# Patient Record
Sex: Female | Born: 1935 | Race: White | Hispanic: No | Marital: Single | Smoking: Never smoker
Health system: Southern US, Community
[De-identification: ages and names within clinical notes are randomized; demographics above are authoritative.]

## PROBLEM LIST (undated history)

## (undated) DIAGNOSIS — F0390 Unspecified dementia without behavioral disturbance: Secondary | ICD-10-CM

---

## 2017-05-08 ENCOUNTER — Emergency Department (HOSPITAL_COMMUNITY): Payer: Medicare Other

## 2017-05-08 ENCOUNTER — Observation Stay (HOSPITAL_COMMUNITY)
Admission: EM | Admit: 2017-05-08 | Discharge: 2017-05-09 | Disposition: A | Discharge status: Home / Self Care | Payer: Medicare Other | Attending: Family Medicine | Admitting: Family Medicine

## 2017-05-08 ENCOUNTER — Encounter (HOSPITAL_COMMUNITY): Payer: Self-pay | Admitting: *Deleted

## 2017-05-08 DIAGNOSIS — M1611 Unilateral primary osteoarthritis, right hip: Secondary | ICD-10-CM | POA: Diagnosis not present

## 2017-05-08 DIAGNOSIS — W19XXXA Unspecified fall, initial encounter: Secondary | ICD-10-CM | POA: Diagnosis not present

## 2017-05-08 DIAGNOSIS — M545 Low back pain, unspecified: Secondary | ICD-10-CM

## 2017-05-08 DIAGNOSIS — Z79899 Other long term (current) drug therapy: Secondary | ICD-10-CM | POA: Diagnosis not present

## 2017-05-08 DIAGNOSIS — M47816 Spondylosis without myelopathy or radiculopathy, lumbar region: Secondary | ICD-10-CM | POA: Insufficient documentation

## 2017-05-08 DIAGNOSIS — I491 Atrial premature depolarization: Secondary | ICD-10-CM | POA: Diagnosis not present

## 2017-05-08 DIAGNOSIS — E440 Moderate protein-calorie malnutrition: Secondary | ICD-10-CM | POA: Diagnosis not present

## 2017-05-08 DIAGNOSIS — N3289 Other specified disorders of bladder: Secondary | ICD-10-CM | POA: Diagnosis not present

## 2017-05-08 DIAGNOSIS — R55 Syncope and collapse: Secondary | ICD-10-CM | POA: Diagnosis not present

## 2017-05-08 DIAGNOSIS — E785 Hyperlipidemia, unspecified: Secondary | ICD-10-CM | POA: Insufficient documentation

## 2017-05-08 DIAGNOSIS — Z681 Body mass index (BMI) 19 or less, adult: Secondary | ICD-10-CM | POA: Insufficient documentation

## 2017-05-08 DIAGNOSIS — I10 Essential (primary) hypertension: Secondary | ICD-10-CM | POA: Insufficient documentation

## 2017-05-08 DIAGNOSIS — R531 Weakness: Secondary | ICD-10-CM | POA: Insufficient documentation

## 2017-05-08 DIAGNOSIS — F0390 Unspecified dementia without behavioral disturbance: Secondary | ICD-10-CM | POA: Diagnosis not present

## 2017-05-08 HISTORY — DX: Unspecified dementia, unspecified severity, without behavioral disturbance, psychotic disturbance, mood disturbance, and anxiety: F03.90

## 2017-05-08 LAB — CBC WITH DIFFERENTIAL/PLATELET
BASOS ABS: 0 10*3/uL (ref 0.0–0.1)
Basophils Relative: 0 %
EOS PCT: 0 %
Eosinophils Absolute: 0 10*3/uL (ref 0.0–0.7)
HCT: 40.1 % (ref 36.0–46.0)
HEMOGLOBIN: 13.5 g/dL (ref 12.0–15.0)
LYMPHS PCT: 23 %
Lymphs Abs: 1.3 10*3/uL (ref 0.7–4.0)
MCH: 30.5 pg (ref 26.0–34.0)
MCHC: 33.7 g/dL (ref 30.0–36.0)
MCV: 90.7 fL (ref 78.0–100.0)
Monocytes Absolute: 0.5 10*3/uL (ref 0.1–1.0)
Monocytes Relative: 10 %
NEUTROS PCT: 67 %
Neutro Abs: 3.7 10*3/uL (ref 1.7–7.7)
PLATELETS: 230 10*3/uL (ref 150–400)
RBC: 4.42 MIL/uL (ref 3.87–5.11)
RDW: 12.8 % (ref 11.5–15.5)
WBC: 5.6 10*3/uL (ref 4.0–10.5)

## 2017-05-08 LAB — HEPATIC FUNCTION PANEL
ALK PHOS: 53 U/L (ref 38–126)
ALT: 9 U/L — ABNORMAL LOW (ref 14–54)
AST: 16 U/L (ref 15–41)
Albumin: 4.2 g/dL (ref 3.5–5.0)
BILIRUBIN TOTAL: 0.7 mg/dL (ref 0.3–1.2)
Bilirubin, Direct: <0.1 mg/dL — ABNORMAL LOW (ref 0.1–0.5)
TOTAL PROTEIN: 7.9 g/dL (ref 6.5–8.1)

## 2017-05-08 LAB — BASIC METABOLIC PANEL
ANION GAP: 10 (ref 5–15)
BUN: 17 mg/dL (ref 6–20)
CO2: 25 mmol/L (ref 22–32)
Calcium: 9.3 mg/dL (ref 8.9–10.3)
Chloride: 105 mmol/L (ref 101–111)
Creatinine, Ser: 0.96 mg/dL (ref 0.44–1.00)
GFR, EST NON AFRICAN AMERICAN: 54 mL/min — AB (ref >60)
Glucose, Bld: 100 mg/dL — ABNORMAL HIGH (ref 65–99)
POTASSIUM: 3.9 mmol/L (ref 3.5–5.1)
SODIUM: 140 mmol/L (ref 135–145)

## 2017-05-08 LAB — TYPE AND SCREEN
ABO/RH(D): O POS
ANTIBODY SCREEN: NEGATIVE

## 2017-05-08 LAB — PROTIME-INR
INR: 0.99
PROTHROMBIN TIME: 13.1 s (ref 11.4–15.2)

## 2017-05-08 LAB — TROPONIN I: Troponin I: <0.03 ng/mL (ref <0.03)

## 2017-05-08 LAB — ABO/RH: ABO/RH(D): O POS

## 2017-05-08 LAB — LIPASE, BLOOD: LIPASE: 37 U/L (ref 11–51)

## 2017-05-08 LAB — TSH: TSH: 1.152 u[IU]/mL (ref 0.350–4.500)

## 2017-05-08 LAB — MRSA PCR SCREENING: MRSA by PCR: NEGATIVE

## 2017-05-08 MED ORDER — ASPIRIN EC 81 MG PO TBEC
162.0000 mg | DELAYED_RELEASE_TABLET | Freq: Every day | ORAL | Status: DC
  Administered 2017-05-08 – 2017-05-09 (×2): 162 mg via ORAL
  Filled 2017-05-08 (×2): qty 2

## 2017-05-08 MED ORDER — ONDANSETRON HCL 4 MG/2ML IJ SOLN: 4.0000 mg | Freq: Four times a day (QID) | INTRAMUSCULAR | Status: DC | PRN

## 2017-05-08 MED ORDER — LISINOPRIL 20 MG PO TABS
20.0000 mg | ORAL_TABLET | Freq: Every day | ORAL | Status: DC
  Administered 2017-05-09: 20 mg via ORAL
  Filled 2017-05-08: qty 1

## 2017-05-08 MED ORDER — ZOLPIDEM TARTRATE 5 MG PO TABS: 5.0000 mg | ORAL_TABLET | Freq: Every evening | ORAL | Status: DC | PRN

## 2017-05-08 MED ORDER — ACETAMINOPHEN 650 MG RE SUPP: 650.0000 mg | Freq: Four times a day (QID) | RECTAL | Status: DC | PRN

## 2017-05-08 MED ORDER — ONDANSETRON HCL 4 MG PO TABS: 4.0000 mg | ORAL_TABLET | Freq: Four times a day (QID) | ORAL | Status: DC | PRN

## 2017-05-08 MED ORDER — SODIUM CHLORIDE 0.9 % IV SOLN
INTRAVENOUS | Status: DC
  Administered 2017-05-08 – 2017-05-09 (×4): via INTRAVENOUS

## 2017-05-08 MED ORDER — MORPHINE SULFATE (PF) 2 MG/ML IV SOLN
4.0000 mg | Freq: Once | INTRAVENOUS | Status: AC
  Administered 2017-05-08: 4 mg via INTRAVENOUS
  Filled 2017-05-08: qty 2

## 2017-05-08 MED ORDER — SODIUM CHLORIDE 0.9% FLUSH: 3.0000 mL | INTRAVENOUS | Status: DC | PRN

## 2017-05-08 MED ORDER — SIMVASTATIN 40 MG PO TABS
40.0000 mg | ORAL_TABLET | Freq: Every day | ORAL | Status: DC
  Administered 2017-05-09: 40 mg via ORAL
  Filled 2017-05-08: qty 1

## 2017-05-08 MED ORDER — SODIUM CHLORIDE 0.9% FLUSH: 3.0000 mL | Freq: Two times a day (BID) | INTRAVENOUS | Status: DC

## 2017-05-08 MED ORDER — ONDANSETRON HCL 4 MG/2ML IJ SOLN
4.0000 mg | Freq: Once | INTRAMUSCULAR | Status: AC
  Administered 2017-05-08: 4 mg via INTRAVENOUS
  Filled 2017-05-08: qty 2

## 2017-05-08 MED ORDER — HYDROCODONE-ACETAMINOPHEN 5-325 MG PO TABS
1.0000 | ORAL_TABLET | ORAL | Status: DC | PRN
  Filled 2017-05-08: qty 1

## 2017-05-08 MED ORDER — SODIUM CHLORIDE 0.9 % IV SOLN: 250.0000 mL | INTRAVENOUS | Status: DC | PRN

## 2017-05-08 MED ORDER — ACETAMINOPHEN 325 MG PO TABS
650.0000 mg | ORAL_TABLET | Freq: Four times a day (QID) | ORAL | Status: DC | PRN
  Administered 2017-05-09 (×2): 650 mg via ORAL
  Filled 2017-05-08 (×2): qty 2

## 2017-05-08 MED ORDER — HEPARIN SODIUM (PORCINE) 5000 UNIT/ML IJ SOLN
5000.0000 [IU] | Freq: Three times a day (TID) | INTRAMUSCULAR | Status: DC
  Administered 2017-05-08 – 2017-05-09 (×4): 5000 [IU] via SUBCUTANEOUS
  Filled 2017-05-08 (×4): qty 1

## 2017-05-08 NOTE — ED Notes (Signed)
Pt going to room 1437. Hilary RN accepting pt. Phone # for report [phone]

## 2017-05-08 NOTE — ED Notes (Signed)
ED TO INPATIENT HANDOFF REPORT  Name/Age/Gender Tonya Marks 83 y.o. female  Home/SNF/Other Nursing Home  Chief Complaint Fall  Code Status History    This patient does not have a recorded code status. Please follow your organizational policy for patients in this situation.    Advance Directive Documentation     Most Recent Value  Type of Advance Directive  Healthcare Power of Attorney, Living will  Pre-existing out of facility DNR order (yellow form or pink MOST form)  -  "MOST" Form in Place?  -      Level of Care/Admitting Diagnosis ED Disposition    ED Disposition Condition [facility]: [place] [100102]  Level of Care: Telemetry [5]  Admit to tele based on following criteria: Eval of Syncope  Diagnosis: Syncope [206001]  Admitting Physician: Laren Everts, [place]  Attending Physician: Laren Everts, ALI Marshal.B[place]  PT Class (Do Not Modify): Observation [104]  PT Acc Code (Do Not Modify): Observation [10022]       Medical History Past Medical History:  Diagnosis Date  . Dementia     Allergies No Known Allergies  IV Location/Drains/Wounds Patient Lines/Drains/Airways Status   Active Line/Drains/Airways    Name:   Placement date:   Placement time:   Site:   Days:   Peripheral IV 05/08/17 Left Antecubital  05/08/17    1222    Antecubital    less than 1          Labs/Imaging Results for orders placed or performed during the hospital encounter of 05/08/17 (from the past 48 hour(s))  Type and screen [place]     Status: None   Collection Time: 05/08/17 12:05 PM  Result Value Ref Range   ABO/RH(D) O POS    Antibody Screen NEG    Sample Expiration 48/09/6551   Basic metabolic panel     Status: Abnormal   Collection Time: 05/08/17 12:13 PM  Result Value Ref Range   Sodium 140 135 - 145 mmol/L   Potassium 3.9 3.5 - 5.1 mmol/L   Chloride 105 101 - 111 mmol/L   CO2 25 22 - 32 mmol/L   Glucose, Bld 100  (H) 65 - 99 mg/dL   BUN 17 6 - 20 mg/dL   Creatinine, Ser 0.96 0.44 - 1.00 mg/dL   Calcium 9.3 8.9 - 10.3 mg/dL   GFR calc non Af Amer 54 (L) >60 mL/min   GFR calc Af Amer >60 >60 mL/min    Comment: (NOTE) The eGFR has been calculated using the CKD EPI equation. This calculation has not been validated in all clinical situations. eGFR's persistently <60 mL/min signify possible Chronic Kidney Disease.    Anion gap 10 5 - 15  CBC WITH DIFFERENTIAL     Status: None   Collection Time: 05/08/17 12:13 PM  Result Value Ref Range   WBC 5.6 4.0 - 10.5 K/uL   RBC 4.42 3.87 - 5.11 MIL/uL   Hemoglobin 13.5 12.0 - 15.0 g/dL   HCT 40.1 36.0 - 46.0 %   MCV 90.7 78.0 - 100.0 fL   MCH 30.5 26.0 - 34.0 pg   MCHC 33.7 30.0 - 36.0 g/dL   RDW 12.8 11.5 - 15.5 %   Platelets 230 150 - 400 K/uL   Neutrophils Relative % 67 %   Neutro Abs 3.7 1.7 - 7.7 K/uL   Lymphocytes Relative 23 %   Lymphs Abs 1.3 0.7 - 4.0 K/uL   Monocytes  Relative 10 %   Monocytes Absolute 0.5 0.1 - 1.0 K/uL   Eosinophils Relative 0 %   Eosinophils Absolute 0.0 0.0 - 0.7 K/uL   Basophils Relative 0 %   Basophils Absolute 0.0 0.0 - 0.1 K/uL  Protime-INR     Status: None   Collection Time: 05/08/17 12:13 PM  Result Value Ref Range   Prothrombin Time 13.1 11.4 - 15.2 seconds   INR 0.99   Hepatic function panel     Status: Abnormal   Collection Time: 05/08/17 12:13 PM  Result Value Ref Range   Total Protein 7.9 6.5 - 8.1 g/dL   Albumin 4.2 3.5 - 5.0 g/dL   AST 16 15 - 41 U/L   ALT 9 (L) 14 - 54 U/L   Alkaline Phosphatase 53 38 - 126 U/L   Total Bilirubin 0.7 0.3 - 1.2 mg/dL   Bilirubin, Direct <0.1 (L) 0.1 - 0.5 mg/dL   Indirect Bilirubin NOT CALCULATED 0.3 - 0.9 mg/dL  Lipase, blood     Status: None   Collection Time: 05/08/17 12:13 PM  Result Value Ref Range   Lipase 37 11 - 51 U/L  Troponin I     Status: None   Collection Time: 05/08/17 12:13 PM  Result Value Ref Range   Troponin I <0.03 <0.03 ng/mL   Ct Abdomen  Pelvis Wo Contrast  Result Date: 05/08/2017 CLINICAL DATA:  Flank pain. EXAM: CT ABDOMEN AND PELVIS WITHOUT CONTRAST TECHNIQUE: Multidetector CT imaging of the abdomen and pelvis was performed following the standard protocol without IV contrast. COMPARISON:  None. FINDINGS: Lower chest: No acute abnormality. Hepatobiliary: No focal liver abnormality is seen. Status post cholecystectomy. No biliary dilatation. Pancreas: Unremarkable. No pancreatic ductal dilatation or surrounding inflammatory changes. Spleen: Normal in size without focal abnormality. Adrenals/Urinary Tract: Adrenal glands are unremarkable. Kidneys are normal, without renal calculi, focal lesion, or hydronephrosis. Mildly distended urinary bladder is noted. Stomach/Bowel: The stomach appears normal. There is no evidence of bowel obstruction or inflammation. Surgical anastomosis is seen involving the colon in the left lower quadrant. Vascular/Lymphatic: Aortic atherosclerosis. No enlarged abdominal or pelvic lymph nodes. Reproductive: Status post hysterectomy. No adnexal masses. Other: No abdominal wall hernia or abnormality. No abdominopelvic ascites. Musculoskeletal: No acute or significant osseous findings. IMPRESSION: Aortic atherosclerosis. Mildly distended urinary bladder. No other abnormality seen in the abdomen or pelvis. Electronically Signed   By: Marijo Conception, M.D.   On: 05/08/2017 14:16   Dg Lumbar Spine Complete  Result Date: 05/08/2017 CLINICAL DATA:  Unwitnessed fall. EXAM: LUMBAR SPINE - COMPLETE 4+ VIEW COMPARISON:  No recent prior. FINDINGS: Mild lumbar spine scoliosis concave right. Diffuse osteopenia degenerative change. 7 mm anterolisthesis L3 on L4. 6 mm anterolisthesis L4 on L5. No evidence of fracture. Lumbar spine numbered with lowest segmented appearing lumbar shaped vertebral lateral view as L5. Aortoiliac atherosclerotic vascular calcification. IMPRESSION: Diffuse osteopenia and degenerative change. 7 mm  anterolisthesis L3 on L4. 6 mm anterolisthesis L4 on L5. Electronically Signed   By: Marcello Moores  Register   On: 05/08/2017 12:24   Ct Head Wo Contrast  Result Date: 05/08/2017 CLINICAL DATA:  Unwitnessed fall.  Syncope. EXAM: CT HEAD WITHOUT CONTRAST TECHNIQUE: Contiguous axial images were obtained from the base of the skull through the vertex without intravenous contrast. COMPARISON:  None. FINDINGS: Brain: Diffuse cerebral atrophy. No acute intracranial abnormality. Specifically, no hemorrhage, hydrocephalus, mass lesion, acute infarction, or significant intracranial injury. Vascular: No hyperdense vessel or unexpected calcification. Skull: No acute calvarial  abnormality. Sinuses/Orbits: Visualized paranasal sinuses and mastoids clear. Orbital soft tissues unremarkable. Other: None IMPRESSION: No acute intracranial abnormality.  Diffuse atrophy. Electronically Signed   By: Rolm Baptise M.D.   On: 05/08/2017 12:56   Dg Hip Unilat With Pelvis 2-3 Views Right  Result Date: 05/08/2017 CLINICAL DATA:  Fall. EXAM: DG HIP (WITH OR WITHOUT PELVIS) 2-3V RIGHT COMPARISON:  No prior. FINDINGS: No acute bony abnormality identified. No evidence of fracture. Diffuse osteopenia and degenerative change. Surgical sutures noted over the pelvis. IMPRESSION: Diffuse osteopenia and degenerative change. No acute abnormality identified. Electronically Signed   By: Marcello Moores  Register   On: 05/08/2017 12:22    Pending Labs [employer]    Start     Ordered   05/08/17 1213  ABO/Rh  Once,   R     05/08/17 1213   Signed and Held  Troponin I  Now then every 6 hours,   R     Signed and Held   Signed and Held  TSH  Once,   R     Signed and Held      Isolation Precautions No active isolations  Vitals/Pain Today's Vitals   05/08/17 1130  BP: (!) 159/90  Pulse: 69  Resp: 19  Temp: 98.5 F (36.9 C)  TempSrc: Oral  SpO2: 100%    Medications Medications  0.9 %  sodium chloride infusion ( Intravenous New Bag/Given  05/08/17 1216)  morphine 2 MG/ML injection 4 mg (4 mg Intravenous Given 05/08/17 1217)  ondansetron (ZOFRAN) injection 4 mg (4 mg Intravenous Given 05/08/17 1217)    Mobility walks

## 2017-05-08 NOTE — ED Notes (Signed)
Bed: [id number] Expected date:  Expected time:  Means of arrival:  Comments: EMS- elderly, fall/back pain

## 2017-05-08 NOTE — ED Notes (Signed)
Patient tried to ambulate but got very nauseous and sharp abd pains. Patient requested to lay back down and use the restroom at a later time. EDP and RN notified.

## 2017-05-08 NOTE — H&P (Signed)
[facility]                                                                                    Patient Demographics  Tonya Marks, is a 83 y.o. female  CSN: [id number]  MRN: [id number]  DOB - [birth year]  Admit Date - 05/08/2017  Outpatient Primary MD for the patient is System, Pcp Not In   With History of -  Past Medical History:  Diagnosis Date  . Dementia       History reviewed. No pertinent surgical history.  in for   Chief Complaint  Patient presents with  . Fall  . Back Pain     HPI  Tonya Marks  is a 83 y.o. female, who lives in an independent living facility and was found today by her son on the floor. She remembers that she lost her balance and was unable to get up from the floor. Patient denies any preceding chest pains or shortness of breath . Does not remember if she lost consciousness. No history of head trauma. Patient started complaining of back pain after the fall and had back pain and abdominal pain during an ambulation trial in the emergency room. X-ray of the back and the pelvis showed osteopenia with degenerative changes.    Review of Systems    In addition to the HPI above, No Fever-chills, No Headache, No changes with Vision or hearing, No problems swallowing food or Liquids, No Chest pain, Cough or Shortness of Breath, No Abdominal pain, No Nausea or Vommitting, Bowel movements are regular, No Blood in stool or Urine, No dysuria, No new skin rashes or bruises, No new weakness, tingling, numbness in any extremity, No recent weight gain or loss, No polyuria, polydypsia or polyphagia, No significant Mental Stressors.  A full 10 point Review of Systems was done, except as stated above, all other Review of Systems were negative.   Social History Social History  Substance Use Topics  . Smoking status: Never Smoker  . Smokeless tobacco: Never Used  . Alcohol use No     Family History No family history on  file.   Prior to Admission medications   Medication Sig Start Date End Date Taking? Authorizing Provider  lisinopril (PRINIVIL,ZESTRIL) 20 MG tablet Take 20 mg by mouth daily.   Yes [provider]  simvastatin (ZOCOR) 40 MG tablet Take 40 mg by mouth daily.   Yes [provider]    No Known Allergies  Physical Exam  Vitals  Blood pressure (!) 159/90, pulse 69, temperature 98.5 F (36.9 C), temperature source Oral, resp. rate 19, SpO2 100 %.   1. General Extremely pleasant, well-nourished, well developed female  2. Normal affect and insight, Not Suicidal or Homicidal, Awake Alert, Oriented X 3.  3. No F.N deficits, grossly, patient moves all extremities  4. Ears and Eyes appear Normal, Conjunctivae clear, PERRLA. Moist Oral Mucosa.  5. Supple Neck, No JVD, No cervical lymphadenopathy appriciated, No Carotid Bruits.  6. Symmetrical Chest wall movement, Good air movement bilaterally, CTAB.  7. RRR, No Gallops, Rubs or Murmurs, No Parasternal Heave.  8. Positive Bowel Sounds, Abdomen Soft, Non tender, No  organomegaly appriciated,No rebound -guarding or rigidity.  9.  No Cyanosis, Normal Skin Turgor, No Skin Rash or Bruise.  10. Good muscle tone,  joints appear normal , no effusions, Normal ROM.    Data Review  CBC  Recent Labs Lab 05/08/17 1213  WBC 5.6  HGB 13.5  HCT 40.1  PLT 230  MCV 90.7  MCH 30.5  MCHC 33.7  RDW 12.8  LYMPHSABS 1.3  MONOABS 0.5  EOSABS 0.0  BASOSABS 0.0   ------------------------------------------------------------------------------------------------------------------  Chemistries   Recent Labs Lab 05/08/17 1213  NA 140  K 3.9  CL 105  CO2 25  GLUCOSE 100*  BUN 17  CREATININE 0.96  CALCIUM 9.3  AST 16  ALT 9*  ALKPHOS 53  BILITOT 0.7   ------------------------------------------------------------------------------------------------------------------ CrCl cannot be calculated (Unknown ideal  weight.). ------------------------------------------------------------------------------------------------------------------ No results for input(s): TSH, T4TOTAL, T3FREE, THYROIDAB in the last 72 hours.  Invalid input(s): FREET3   Coagulation profile  Recent Labs Lab 05/08/17 1213  INR 0.99   ------------------------------------------------------------------------------------------------------------------- No results for input(s): DDIMER in the last 72 hours. -------------------------------------------------------------------------------------------------------------------  Cardiac Enzymes  Recent Labs Lab 05/08/17 1213  TROPONINI <0.03   ------------------------------------------------------------------------------------------------------------------ Invalid input(s): POCBNP   ---------------------------------------------------------------------------------------------------------------  Urinalysis No results found for: COLORURINE, APPEARANCEUR, LABSPEC, PHURINE, GLUCOSEU, HGBUR, BILIRUBINUR, KETONESUR, PROTEINUR, UROBILINOGEN, NITRITE, LEUKOCYTESUR  ----------------------------------------------------------------------------------------------------------------     Imaging results:   Ct Abdomen Pelvis Wo Contrast  Result Date: 05/08/2017 CLINICAL DATA:  Flank pain. EXAM: CT ABDOMEN AND PELVIS WITHOUT CONTRAST TECHNIQUE: Multidetector CT imaging of the abdomen and pelvis was performed following the standard protocol without IV contrast. COMPARISON:  None. FINDINGS: Lower chest: No acute abnormality. Hepatobiliary: No focal liver abnormality is seen. Status post cholecystectomy. No biliary dilatation. Pancreas: Unremarkable. No pancreatic ductal dilatation or surrounding inflammatory changes. Spleen: Normal in size without focal abnormality. Adrenals/Urinary Tract: Adrenal glands are unremarkable. Kidneys are normal, without renal calculi, focal lesion, or hydronephrosis.  Mildly distended urinary bladder is noted. Stomach/Bowel: The stomach appears normal. There is no evidence of bowel obstruction or inflammation. Surgical anastomosis is seen involving the colon in the left lower quadrant. Vascular/Lymphatic: Aortic atherosclerosis. No enlarged abdominal or pelvic lymph nodes. Reproductive: Status post hysterectomy. No adnexal masses. Other: No abdominal wall hernia or abnormality. No abdominopelvic ascites. Musculoskeletal: No acute or significant osseous findings. IMPRESSION: Aortic atherosclerosis. Mildly distended urinary bladder. No other abnormality seen in the abdomen or pelvis. Electronically Signed   By: Lupita RaiderJames  Green Jr, M.D.   On: 05/08/2017 14:16   Dg Lumbar Spine Complete  Result Date: 05/08/2017 CLINICAL DATA:  Unwitnessed fall. EXAM: LUMBAR SPINE - COMPLETE 4+ VIEW COMPARISON:  No recent prior. FINDINGS: Mild lumbar spine scoliosis concave right. Diffuse osteopenia degenerative change. 7 mm anterolisthesis L3 on L4. 6 mm anterolisthesis L4 on L5. No evidence of fracture. Lumbar spine numbered with lowest segmented appearing lumbar shaped vertebral lateral view as L5. Aortoiliac atherosclerotic vascular calcification. IMPRESSION: Diffuse osteopenia and degenerative change. 7 mm anterolisthesis L3 on L4. 6 mm anterolisthesis L4 on L5. Electronically Signed   By: Maisie Fushomas  Register   On: 05/08/2017 12:24   Ct Head Wo Contrast  Result Date: 05/08/2017 CLINICAL DATA:  Unwitnessed fall.  Syncope. EXAM: CT HEAD WITHOUT CONTRAST TECHNIQUE: Contiguous axial images were obtained from the base of the skull through the vertex without intravenous contrast. COMPARISON:  None. FINDINGS: Brain: Diffuse cerebral atrophy. No acute intracranial abnormality. Specifically, no hemorrhage, hydrocephalus, mass lesion, acute infarction, or significant intracranial injury. Vascular: No hyperdense vessel or unexpected  calcification. Skull: No acute calvarial abnormality. Sinuses/Orbits:  Visualized paranasal sinuses and mastoids clear. Orbital soft tissues unremarkable. Other: None IMPRESSION: No acute intracranial abnormality.  Diffuse atrophy. Electronically Signed   By: Charlett Nose M.D.   On: 05/08/2017 12:56   Dg Hip Unilat With Pelvis 2-3 Views Right  Result Date: 05/08/2017 CLINICAL DATA:  Fall. EXAM: DG HIP (WITH OR WITHOUT PELVIS) 2-3V RIGHT COMPARISON:  No prior. FINDINGS: No acute bony abnormality identified. No evidence of fracture. Diffuse osteopenia and degenerative change. Surgical sutures noted over the pelvis. IMPRESSION: Diffuse osteopenia and degenerative change. No acute abnormality identified. Electronically Signed   By: Maisie Fus  Register   On: 05/08/2017 12:22    My personal review of EKG: Rhythm NSR, right axis deviation with nonspecific ST and T-wave changes/poor tracing    Assessment & Plan  1. Syncope 2. Back pain with negative x-rays except for osteopenia and mild degenerative joint disease 3. Weakness  Plan  Place in observation/telemetry Consultants social worker for possible placement . I think that the patient may need to stay at the rehabilitation facility to be able to take care of her ADLs PT/OT evaluation Serial troponins Check echocardiogram   DVT Prophylaxis Heparin   AM Labs Ordered, also please review Full Orders  Family Communication: Admission, patients condition and plan of care including tests being ordered have been discussed with the patient and son who indicate understanding and agree with the plan and Code Status.  Code Status full  Disposition Plan: To be determined  Time spent in minutes : 36 minutes  Condition GUARDED   @SIGNATURE @

## 2017-05-08 NOTE — ED Provider Notes (Signed)
[facility]-EMERGENCY DEPT Provider Note   CSN: [id number] Arrival date & time: 05/08/17  1115     History   Chief Complaint Chief Complaint  Patient presents with  . Fall  . Back Pain    HPI Tonya Marks is a 83 y.o. female.  HPI  patient presents to the emergency room for evaluation of an unwitnessed fall and back pain. Patient is a resident of an independent lility. Her son went to visit her today and found her on the floor. Patient was complaining of pain in her right lower back. Pain increased with movement of her right leg as well as any movement of her lower back.  Pt does not think she lost consciousness.  She denies trouble with chest pain or shortness of breath.  No numbness or weakness. Past Medical History:  Diagnosis Date  . Dementia     There are no active problems to display for this patient.   History reviewed. No pertinent surgical history.  OB History    No data available       Home Medications    Prior to Admission medications   Medication Sig Start Date End Date Taking? Authorizing Provider  lisinopril (PRINIVIL,ZESTRIL) 20 MG tablet Take 20 mg by mouth daily.   Yes [provider]  simvastatin (ZOCOR) 40 MG tablet Take 40 mg by mouth daily.   Yes [provider]    Family History No family history on file.  Social History Social History  Substance Use Topics  . Smoking status: Never Smoker  . Smokeless tobacco: Never Used  . Alcohol use No     Allergies   Patient has no known allergies.   Review of Systems Review of Systems  All other systems reviewed and are negative.    Physical Exam Updated Vital Signs BP (!) 159/90 (BP Location: Left Arm)   Pulse 69   Temp 98.5 F (36.9 C) (Oral)   Resp 19   SpO2 100%   Physical Exam  Constitutional: She appears well-developed and well-nourished. No distress.  HENT:  Head: Normocephalic and atraumatic.  Right Ear: External ear normal.  Left Ear: External ear  normal.  Eyes: Conjunctivae are normal. Right eye exhibits no discharge. Left eye exhibits no discharge. No scleral icterus.  Neck: Neck supple. No tracheal deviation present.  Cardiovascular: Normal rate, regular rhythm and intact distal pulses.   Pulmonary/Chest: Effort normal and breath sounds normal. No stridor. No respiratory distress. She has no wheezes. She has no rales.  Abdominal: Soft. Bowel sounds are normal. She exhibits no distension. There is no tenderness. There is no rebound and no guarding.  Musculoskeletal: She exhibits no edema.       Lumbar back: She exhibits tenderness and bony tenderness. She exhibits no swelling, no edema and no deformity.  Neurological: She is alert. She has normal strength. No cranial nerve deficit (no facial droop, extraocular movements intact, no slurred speech) or sensory deficit. She exhibits normal muscle tone. She displays no seizure activity. Coordination normal.  Skin: Skin is warm and dry. No rash noted.  Psychiatric: She has a normal mood and affect.  Nursing note and vitals reviewed.    ED Treatments / Results  Labs (all labs ordered are listed, but only abnormal results are displayed) Labs Reviewed  BASIC METABOLIC PANEL - Abnormal; Notable for the following:       Result Value   Glucose, Bld 100 (*)    GFR calc non Af Amer 54 (*)  All other components within normal limits  HEPATIC FUNCTION PANEL - Abnormal; Notable for the following:    ALT 9 (*)    Bilirubin, Direct <0.1 (*)    All other components within normal limits  CBC WITH DIFFERENTIAL/PLATELET  PROTIME-INR  LIPASE, BLOOD  TROPONIN I  TYPE AND SCREEN  ABO/RH    EKG  EKG Interpretation  Date/Time:  Tuesday May 08 2017 11:29:33 EDT Ventricular Rate:  68 PR Interval:    QRS Duration: 105 QT Interval:  435 QTC Calculation: 463 R Axis:   -148 Text Interpretation:  Sinus rhythm Low voltage with right axis deviation Consider anterior infarct Nonspecific repol  abnormality, lateral leads Baseline wander in lead(s) I III aVR aVL Poor data quality No old tracing to compare Confirmed by Linwood Dibbles [phone]) on 05/08/2017 12:29:35 PM       Radiology Ct Abdomen Pelvis Wo Contrast  Result Date: 05/08/2017 CLINICAL DATA:  Flank pain. EXAM: CT ABDOMEN AND PELVIS WITHOUT CONTRAST TECHNIQUE: Multidetector CT imaging of the abdomen and pelvis was performed following the standard protocol without IV contrast. COMPARISON:  None. FINDINGS: Lower chest: No acute abnormality. Hepatobiliary: No focal liver abnormality is seen. Status post cholecystectomy. No biliary dilatation. Pancreas: Unremarkable. No pancreatic ductal dilatation or surrounding inflammatory changes. Spleen: Normal in size without focal abnormality. Adrenals/Urinary Tract: Adrenal glands are unremarkable. Kidneys are normal, without renal calculi, focal lesion, or hydronephrosis. Mildly distended urinary bladder is noted. Stomach/Bowel: The stomach appears normal. There is no evidence of bowel obstruction or inflammation. Surgical anastomosis is seen involving the colon in the left lower quadrant. Vascular/Lymphatic: Aortic atherosclerosis. No enlarged abdominal or pelvic lymph nodes. Reproductive: Status post hysterectomy. No adnexal masses. Other: No abdominal wall hernia or abnormality. No abdominopelvic ascites. Musculoskeletal: No acute or significant osseous findings. IMPRESSION: Aortic atherosclerosis. Mildly distended urinary bladder. No other abnormality seen in the abdomen or pelvis. Electronically Signed   By: Lupita Raider, M.D.   On: 05/08/2017 14:16   Dg Lumbar Spine Complete  Result Date: 05/08/2017 CLINICAL DATA:  Unwitnessed fall. EXAM: LUMBAR SPINE - COMPLETE 4+ VIEW COMPARISON:  No recent prior. FINDINGS: Mild lumbar spine scoliosis concave right. Diffuse osteopenia degenerative change. 7 mm anterolisthesis L3 on L4. 6 mm anterolisthesis L4 on L5. No evidence of fracture. Lumbar spine  numbered with lowest segmented appearing lumbar shaped vertebral lateral view as L5. Aortoiliac atherosclerotic vascular calcification. IMPRESSION: Diffuse osteopenia and degenerative change. 7 mm anterolisthesis L3 on L4. 6 mm anterolisthesis L4 on L5. Electronically Signed   By: Maisie Fus  Register   On: 05/08/2017 12:24   Ct Head Wo Contrast  Result Date: 05/08/2017 CLINICAL DATA:  Unwitnessed fall.  Syncope. EXAM: CT HEAD WITHOUT CONTRAST TECHNIQUE: Contiguous axial images were obtained from the base of the skull through the vertex without intravenous contrast. COMPARISON:  None. FINDINGS: Brain: Diffuse cerebral atrophy. No acute intracranial abnormality. Specifically, no hemorrhage, hydrocephalus, mass lesion, acute infarction, or significant intracranial injury. Vascular: No hyperdense vessel or unexpected calcification. Skull: No acute calvarial abnormality. Sinuses/Orbits: Visualized paranasal sinuses and mastoids clear. Orbital soft tissues unremarkable. Other: None IMPRESSION: No acute intracranial abnormality.  Diffuse atrophy. Electronically Signed   By: Charlett Nose M.D.   On: 05/08/2017 12:56   Dg Hip Unilat With Pelvis 2-3 Views Right  Result Date: 05/08/2017 CLINICAL DATA:  Fall. EXAM: DG HIP (WITH OR WITHOUT PELVIS) 2-3V RIGHT COMPARISON:  No prior. FINDINGS: No acute bony abnormality identified. No evidence of fracture. Diffuse osteopenia and degenerative change.  Surgical sutures noted over the pelvis. IMPRESSION: Diffuse osteopenia and degenerative change. No acute abnormality identified. Electronically Signed   By: Maisie Fushomas  Register   On: 05/08/2017 12:22    Procedures Procedures (including critical care time)  Medications Ordered in ED Medications  0.9 %  sodium chloride infusion ( Intravenous New Bag/Given 05/08/17 1216)  morphine 2 MG/ML injection 4 mg (4 mg Intravenous Given 05/08/17 1217)  ondansetron (ZOFRAN) injection 4 mg (4 mg Intravenous Given 05/08/17 1217)     Initial  Impression / Assessment and Plan / ED Course  I have reviewed the triage vital signs and the nursing notes.  Pertinent labs & imaging results that were available during my care of the patient were reviewed by me and considered in my medical decision making (see chart for details).  Clinical Course as of May 09 1503  Tue May 08, 2017  1339 Pt tried to stand up.  She became very nauseated and started complaining of upper abdominal pain.  On repeat exam she has no ttp.  Abdomen is soft.    [JK]  1343 Will add on ct abdomen pelvis to evaluate for acute pathology.  Add on lipase and hepatic function  [JK]    Clinical Course User Index [JK] Linwood DibblesKnapp, Yona Kosek, MD    Patient presented to the emergency room with complaints of a fall and back pain. It's unclear if the patient had a syncopal episode where she just stumbled and fell.  While and she was here in the emergency room when she tried to stand up she started having pain in her abdomen and became nauseated. She was unable to walk. CT scan was performed to evaluate for any acute pathology. Laboratory tests are otherwise reassuring. X-rays do show spondylolisthesis and degenerative changes but no acute fracture. Plan on admission to the hospital for cardiac monitoring, possible PTOT to help her with her back pain. Consider MRI if the pain persists.  Final Clinical Impressions(s) / ED Diagnoses   Final diagnoses:  Fall, initial encounter  Acute right-sided low back pain without sciatica  Syncope, unspecified syncope type      Linwood DibblesKnapp, Maudell Stanbrough, MD 05/08/17 1507

## 2017-05-08 NOTE — ED Triage Notes (Signed)
Per EMS, [facility] from [facility] here for unwitnessed fall. Pt's son found pt on the floor when he went to visit her. Pt denies loss of consciousness. Pt is not on blood thinners. Pt states she fell while walking back from the bathroom. Pt has hx of dementia. Pt complains of right lumbar pain with movement. Pt has good distal PMS, no shortening or rotation noted. Pt normally walks without assistance.

## 2017-05-09 ENCOUNTER — Observation Stay (HOSPITAL_BASED_OUTPATIENT_CLINIC_OR_DEPARTMENT_OTHER): Payer: Medicare Other

## 2017-05-09 DIAGNOSIS — W19XXXA Unspecified fall, initial encounter: Secondary | ICD-10-CM

## 2017-05-09 DIAGNOSIS — R55 Syncope and collapse: Secondary | ICD-10-CM

## 2017-05-09 DIAGNOSIS — E440 Moderate protein-calorie malnutrition: Secondary | ICD-10-CM | POA: Diagnosis not present

## 2017-05-09 DIAGNOSIS — I361 Nonrheumatic tricuspid (valve) insufficiency: Secondary | ICD-10-CM | POA: Diagnosis not present

## 2017-05-09 DIAGNOSIS — F0390 Unspecified dementia without behavioral disturbance: Secondary | ICD-10-CM | POA: Diagnosis present

## 2017-05-09 DIAGNOSIS — R531 Weakness: Secondary | ICD-10-CM | POA: Diagnosis not present

## 2017-05-09 DIAGNOSIS — M545 Low back pain: Secondary | ICD-10-CM | POA: Diagnosis not present

## 2017-05-09 LAB — ECHOCARDIOGRAM COMPLETE
Height: 67 in
Weight: 1865.97 oz

## 2017-05-09 LAB — TROPONIN I

## 2017-05-09 MED ORDER — ADULT MULTIVITAMIN W/MINERALS CH
1.0000 | ORAL_TABLET | Freq: Every day | ORAL | Status: DC
  Administered 2017-05-09: 1 via ORAL
  Filled 2017-05-09: qty 1

## 2017-05-09 MED ORDER — ENSURE ENLIVE PO LIQD
237.0000 mL | Freq: Two times a day (BID) | ORAL | Status: DC
  Administered 2017-05-09: 237 mL via ORAL

## 2017-05-09 NOTE — NC FL2 (Deleted)
[place] MEDICAID FL2 LEVEL OF CARE SCREENING TOOL     IDENTIFICATION  Patient Name: Tonya Marks Birthdate: [birth year] Sex: female Admission Date (Current Location): 05/08/2017  [facility] and [place] Number:  Producer, television/film/video and Address:  [facility]  [address], [place] [address]      Provider Number: [id number]  Attending Physician Name and Address:  Tyrone Nine, MD  Relative Name and Phone Number:       Current Level of Care: Hospital Recommended Level of Care: Assisted Living Facility Prior Approval Number:    Date Approved/Denied:   PASRR Number:    Discharge Plan:  (Assited Living Facility )    Current Diagnoses: Patient Active Problem List   Diagnosis Date Noted  . Malnutrition of moderate degree 05/09/2017  . Syncope 05/08/2017    Orientation RESPIRATION BLADDER Height & Weight     Self, Time, Situation, Place  Normal Continent Weight: 116 lb 10 oz (52.9 kg) Height:  5\' 7"  (170.2 cm)  BEHAVIORAL SYMPTOMS/MOOD NEUROLOGICAL BOWEL NUTRITION STATUS      Continent Diet (Regular )  AMBULATORY STATUS COMMUNICATION OF NEEDS Skin   Supervision Verbally PU Stage and Appropriate Care                       Personal Care Assistance Level of Assistance  Bathing, Feeding, Dressing Bathing Assistance: Limited assistance Feeding assistance: Independent Dressing Assistance: Independent     Functional Limitations Info  Sight, Hearing, Speech Sight Info: Adequate Hearing Info: Adequate Speech Info: Adequate    SPECIAL CARE FACTORS FREQUENCY  PT (By licensed PT), OT (By licensed OT)     PT Frequency: 3X/WEEK              Contractures Contractures Info: Not present    Additional Factors Info  Code Status, Allergies Code Status Info: Fullcode  Allergies Info: No Known Allergies           Current Medications (05/09/2017):  This is the current hospital active medication list Current Facility-Administered Medications   Medication Dose Route Frequency Provider Last Rate Last Dose  . 0.9 %  sodium chloride infusion   Intravenous Continuous Linwood Dibbles, MD 125 mL/hr at 05/09/17 1314    . 0.9 %  sodium chloride infusion  250 mL Intravenous PRN Carron Curie, MD      . acetaminophen (TYLENOL) tablet 650 mg  650 mg Oral Q6H PRN Carron Curie, MD   650 mg at 05/09/17 0856   Or  . acetaminophen (TYLENOL) suppository 650 mg  650 mg Rectal Q6H PRN Carron Curie, MD      . aspirin EC tablet 162 mg  162 mg Oral Daily Carron Curie, MD   162 mg at 05/09/17 0856  . feeding supplement (ENSURE ENLIVE) (ENSURE ENLIVE) liquid 237 mL  237 mL Oral BID BM Hazeline Junker B, MD   237 mL at 05/09/17 1319  . heparin injection 5,000 Units  5,000 Units Subcutaneous Q8H Carron Curie, MD   5,000 Units at 05/09/17 1319  . HYDROcodone-acetaminophen (NORCO/VICODIN) 5-325 MG per tablet 1-2 tablet  1-2 tablet Oral Q4H PRN Carron Curie, MD      . lisinopril (PRINIVIL,ZESTRIL) tablet 20 mg  20 mg Oral Daily Carron Curie, MD   20 mg at 05/09/17 0856  . multivitamin with minerals tablet 1 tablet  1 tablet Oral Daily Tyrone Nine, MD   1 tablet at 05/09/17 1319  . ondansetron (ZOFRAN) tablet 4 mg  4 mg Oral Q6H PRN Carron CurieHijazi, Ali, MD       Or  . ondansetron (ZOFRAN) injection 4 mg  4 mg Intravenous Q6H PRN Carron CurieHijazi, Ali, MD      . simvastatin (ZOCOR) tablet 40 mg  40 mg Oral Daily Carron CurieHijazi, Ali, MD   40 mg at 05/09/17 0856  . sodium chloride flush (NS) 0.9 % injection 3 mL  3 mL Intravenous Q12H Hijazi, Karie MainlandAli, MD      . sodium chloride flush (NS) 0.9 % injection 3 mL  3 mL Intravenous PRN Carron CurieHijazi, Ali, MD      . zolpidem (AMBIEN) tablet 5 mg  5 mg Oral QHS PRN Carron CurieHijazi, Ali, MD         Discharge Medications: Please see discharge summary for a list of discharge medications.  Relevant Imaging Results:  Relevant Lab Results:   Additional Information ssn:[phone]  Clearance CootsNicole A Saphia Vanderford, LCSW

## 2017-05-09 NOTE — Care Management Obs Status (Signed)
MEDICARE OBSERVATION STATUS NOTIFICATION   Patient Details  Name: Tonya Marks MRN: [id number] Date of Birth: August 23, 1934   Medicare Observation Status Notification Given:  Yes    Geni BersMcGibboney, Taran Haynesworth, RN 05/09/2017, 1:54 PM

## 2017-05-09 NOTE — Discharge Summary (Signed)
Physician Discharge Summary  Tonya DibbleMildred Marks ZOX:[id number] DOB: 06-12-1934 DOA: 05/08/2017  PCP: System, Pcp Not In  Admit date: 05/08/2017 Discharge date: 05/09/2017  Admitted From: Independent living facility Disposition: Independent living facility   Recommendations for Outpatient Follow-up:  1. Follow up with PCP in 1-2 weeks  Home Health: PT, CSW Equipment/Devices: Rolling walker Discharge Condition: Stable CODE STATUS: Full Diet recommendation: Regular  Brief/Interim Summary: Tonya CroftMildred "Lousie" Tonya Marks is an 83yo female with dementia who presented to the ED 8/14 when her son found her on the floor of her independent living facility. She states she thinks she lost balance and was unable to get up from the floor. She denies loss of consciousness or preceding symptoms but is otherwise unable to recall the events that lead to this or how long she was down. Work up included XR's which were negative, though she reported some back and abdominal pain with ambulation, so was brought in for observation, PT evaluation, and syncope work up.   Discharge Diagnoses:  Active Problems:   Syncope   Malnutrition of moderate degree  Syncope: Possible etiology of pt being found down. Also possibly mechanical fall, weakness, degenerative joint disease seen on XR's. - Only mild sinus bradycardia noted on telemetry, while sleeping, with occasional PACs. Troponins negative. Echocardiogram was performed but was not interpreted prior to discharge.  - Pt did very well with PT, who recommends rolling walker and home health at ILF. Pt's son is reluctant but understanding, so we will also include a Child psychotherapistsocial worker to monitor her progress, and refer for long term care if necessary.   Essential HTN: Stable, chronic.  - Continue lisinopril  Hyperlipidemia: Chronic, stable. No LDL in EMR.  - Continued simvastatin. Consider discontinuing this in an 83yo with dementia and falls.   Discharge Instructions Discharge  Instructions    Discharge instructions    Complete by:  As directed    You were observed for an unwitnessed fall. There has been no evidence to suggest the fall was mediated by an abnormal heart rhythm. Physical therapy has evaluated you and feels you would benefit from physical therapy in your independent living facility, which will be arranged prior to discharge.  - Continue taking medications as you were - Schedule an appointment with your doctor in the next 1 - 2 weeks for hospital follow up. - Seek medical care if you fall again, feel dizzy, or develop chest pain or trouble breathing.     Allergies as of 05/09/2017   No Known Allergies     Medication List    TAKE these medications   lisinopril 20 MG tablet Commonly known as:  PRINIVIL,ZESTRIL Take 20 mg by mouth daily.   simvastatin 40 MG tablet Commonly known as:  ZOCOR Take 40 mg by mouth daily.            Durable Medical Equipment        Start     Ordered   05/09/17 1113  For home use only DME 4 wheeled rolling walker with seat  Once    Question:  Patient needs a walker to treat with the following condition  Answer:  Balance disorder   05/09/17 1114      No Known Allergies  Consultations:  None  Procedures/Studies: Ct Abdomen Pelvis Wo Contrast  Result Date: 05/08/2017 CLINICAL DATA:  Flank pain. EXAM: CT ABDOMEN AND PELVIS WITHOUT CONTRAST TECHNIQUE: Multidetector CT imaging of the abdomen and pelvis was performed following the standard protocol without IV contrast. COMPARISON:  None. FINDINGS: Lower chest: No acute abnormality. Hepatobiliary: No focal liver abnormality is seen. Status post cholecystectomy. No biliary dilatation. Pancreas: Unremarkable. No pancreatic ductal dilatation or surrounding inflammatory changes. Spleen: Normal in size without focal abnormality. Adrenals/Urinary Tract: Adrenal glands are unremarkable. Kidneys are normal, without renal calculi, focal lesion, or hydronephrosis. Mildly  distended urinary bladder is noted. Stomach/Bowel: The stomach appears normal. There is no evidence of bowel obstruction or inflammation. Surgical anastomosis is seen involving the colon in the left lower quadrant. Vascular/Lymphatic: Aortic atherosclerosis. No enlarged abdominal or pelvic lymph nodes. Reproductive: Status post hysterectomy. No adnexal masses. Other: No abdominal wall hernia or abnormality. No abdominopelvic ascites. Musculoskeletal: No acute or significant osseous findings. IMPRESSION: Aortic atherosclerosis. Mildly distended urinary bladder. No other abnormality seen in the abdomen or pelvis. Electronically Signed   By: Lupita Raider, M.D.   On: 05/08/2017 14:16   Dg Lumbar Spine Complete  Result Date: 05/08/2017 CLINICAL DATA:  Unwitnessed fall. EXAM: LUMBAR SPINE - COMPLETE 4+ VIEW COMPARISON:  No recent prior. FINDINGS: Mild lumbar spine scoliosis concave right. Diffuse osteopenia degenerative change. 7 mm anterolisthesis L3 on L4. 6 mm anterolisthesis L4 on L5. No evidence of fracture. Lumbar spine numbered with lowest segmented appearing lumbar shaped vertebral lateral view as L5. Aortoiliac atherosclerotic vascular calcification. IMPRESSION: Diffuse osteopenia and degenerative change. 7 mm anterolisthesis L3 on L4. 6 mm anterolisthesis L4 on L5. Electronically Signed   By: Maisie Fus  Register   On: 05/08/2017 12:24   Ct Head Wo Contrast  Result Date: 05/08/2017 CLINICAL DATA:  Unwitnessed fall.  Syncope. EXAM: CT HEAD WITHOUT CONTRAST TECHNIQUE: Contiguous axial images were obtained from the base of the skull through the vertex without intravenous contrast. COMPARISON:  None. FINDINGS: Brain: Diffuse cerebral atrophy. No acute intracranial abnormality. Specifically, no hemorrhage, hydrocephalus, mass lesion, acute infarction, or significant intracranial injury. Vascular: No hyperdense vessel or unexpected calcification. Skull: No acute calvarial abnormality. Sinuses/Orbits: Visualized  paranasal sinuses and mastoids clear. Orbital soft tissues unremarkable. Other: None IMPRESSION: No acute intracranial abnormality.  Diffuse atrophy. Electronically Signed   By: Charlett Nose M.D.   On: 05/08/2017 12:56   Dg Hip Unilat With Pelvis 2-3 Views Right  Result Date: 05/08/2017 CLINICAL DATA:  Fall. EXAM: DG HIP (WITH OR WITHOUT PELVIS) 2-3V RIGHT COMPARISON:  No prior. FINDINGS: No acute bony abnormality identified. No evidence of fracture. Diffuse osteopenia and degenerative change. Surgical sutures noted over the pelvis. IMPRESSION: Diffuse osteopenia and degenerative change. No acute abnormality identified. Electronically Signed   By: Maisie Fus  Register   On: 05/08/2017 12:22   Subjective: Pt is without complaints currently. She's been having paroxysmal pain in the right lower back that subsided spontaneously. Heating pad seemed to help it. No lightheadedness, dizziness, weakness, numbness, chest pain, dyspnea, palpitations or leg swelling.   Discharge Exam: Vitals:   05/08/17 2043 05/09/17 0539  BP: (!) 117/56 (!) 121/53  Pulse: 72 79  Resp: 17 18  Temp: 98.1 F (36.7 C) 98.1 F (36.7 C)  SpO2: 100% 97%  General: Pt is alert, awake, not in acute distress Cardiovascular: RRR, S1/S2 +, no rubs, no gallops Respiratory: CTA bilaterally, no wheezing, no rhonchi Abdominal: Soft, NT, ND, bowel sounds + MSK: No tenderness or spasm to spine or parasinal muscles. Negative straight leg raise bilaterally. No edema, no cyanosis Neuro: Alert and conversant without aphasia or focal deficits Psych: Not oriented, severely impaired short term recall. Introduces me to her son several times.   Labs: Basic Metabolic Panel:  Recent Labs Lab 05/08/17 1213  NA 140  K 3.9  CL 105  CO2 25  GLUCOSE 100*  BUN 17  CREATININE 0.96  CALCIUM 9.3   Liver Function Tests:  Recent Labs Lab 05/08/17 1213  AST 16  ALT 9*  ALKPHOS 53  BILITOT 0.7  PROT 7.9  ALBUMIN 4.2    Recent Labs Lab  05/08/17 1213  LIPASE 37   CBC:  Recent Labs Lab 05/08/17 1213  WBC 5.6  NEUTROABS 3.7  HGB 13.5  HCT 40.1  MCV 90.7  PLT 230   Cardiac Enzymes:  Recent Labs Lab 05/08/17 1213 05/08/17 1646 05/08/17 2209 05/09/17 0413  TROPONINI <0.03 <0.03 <0.03 <0.03   Thyroid function studies  Recent Labs  05/08/17 1646  TSH 1.152   Microbiology Recent Results (from the past 240 hour(s))  MRSA PCR Screening     Status: None   Collection Time: 05/08/17  6:58 PM  Result Value Ref Range Status   MRSA by PCR NEGATIVE NEGATIVE Final    Comment:        The GeneXpert MRSA Assay (FDA approved for NASAL specimens only), is one component of a comprehensive MRSA colonization surveillance program. It is not intended to diagnose MRSA infection nor to guide or monitor treatment for MRSA infections.     Time coordinating discharge: Approximately 40 minutes  Hazeline Junker, MD  [facility] 05/09/2017, 1:36 PM Pager [phone]

## 2017-05-09 NOTE — Care Management CC44 (Signed)
Condition Code 44 Documentation Completed  Patient Details  Name: Tonya DibbleMildred Hershkowitz MRN: [id number] Date of Birth: [birth year]   Condition Code 44 given:    Patient signature on Condition Code 44 notice:    Documentation of 2 MD's agreement:    Code 44 added to claim:       Geni BersMcGibboney, Sherlin Sonier, RN 05/09/2017, 1:54 PM

## 2017-05-09 NOTE — Progress Notes (Signed)
Physician request patient follow up with PCP to complete FL2 for ALF placement. [facility], she is agreeable with plan.  Vivi BarrackNicole Hiromi Knodel, Theresia MajorsLCSWA, MSW Clinical Social Worker 5E and Psychiatric Service Line [phone]

## 2017-05-09 NOTE — Progress Notes (Signed)
Spoke with pt's sons at bedside concerning HH needs and ALF. At this present time pt will need to have her PCP complete the FL2 and TB. [facility]'s and [facility] of [facility] aware.

## 2017-05-09 NOTE — Progress Notes (Signed)
Initial Nutrition Assessment  DOCUMENTATION CODES:   Non-severe (moderate) malnutrition in context of chronic illness  INTERVENTION:   Ensure Enlive po BID, each supplement provides 350 kcal and 20 grams of protein  Magic cup TID with meals, each supplement provides 290 kcal and 9 grams of protein  MVI  NUTRITION DIAGNOSIS:   Malnutrition (moderate) related to  (advanced age ) as evidenced by severe depletion of muscle mass, severe depletion of body fat.  GOAL:   Patient will meet greater than or equal to 90% of their needs  MONITOR:   PO intake, Supplement acceptance, Labs, Weight trends  REASON FOR ASSESSMENT:   Malnutrition Screening Tool    ASSESSMENT:   83 y/o female with h/o dementia presents for fall   Met with pt and pt's son in room today. Pt reports a slow decline in appetite for the past few years. Pt reports that she still eats well and that she was eating normally pta. Pt is currently eating 100% of meals in hospital. Pt reports that she is weight stable. RD discussed the importance of adequate protein intake with pt and recommended supplements at home. RD will order Ensure and Magic Cups.    Medications reviewed and include: aspirin, heparin   Labs reviewed  Nutrition-Focused physical exam completed. Findings are severe fat and muscle depletions over entire body, and no edema.   Diet Order:  Diet regular Room service appropriate? Yes; Fluid consistency: Thin  Skin:  Reviewed, no issues  Last BM:  8/13  Height:   Ht Readings from Last 1 Encounters:  05/08/17 5' 7"  (1.702 m)    Weight:   Wt Readings from Last 1 Encounters:  05/08/17 116 lb 10 oz (52.9 kg)    Ideal Body Weight:  61.4 kg  BMI:  Body mass index is 18.27 kg/m.  Estimated Nutritional Needs:   Kcal:  1600-1800kcal/day   Protein:  68-79g/day  Fluid:  >1.6L/day   EDUCATION NEEDS:   Education needs addressed  Koleen Distance MS, RD, LDN Pager #[phone] After  Hours Pager: [phone]

## 2017-05-09 NOTE — Evaluation (Signed)
Physical Therapy Evaluation Patient Details Name: Tonya Marks MRN: [id number] DOB: 04-07-1934 Today's Date: 05/09/2017   History of Present Illness  83 y.o. female from an independent living facility with hx of dementia and presented to ED after being found by her son on the floor and admitted for syncope and back pain (negative x-rays except for osteopenia and mild degenerative joint disease)  Clinical Impression  Pt admitted with above diagnosis. Pt currently with functional limitations due to the deficits listed below (see PT Problem List).  Pt will benefit from skilled PT to increase their independence and safety with mobility to allow discharge to the venue listed below.  Pt assisted with ambulating in hallway.  Pt denies dizziness and no LOB observed at time of session.  Pt may benefit from rollator (4 wheeled walker with seat) upon d/c as well as HHPT.     Follow Up Recommendations Home health PT;Supervision - Intermittent    Equipment Recommendations  Other (comment) (4 wheeled walker with seat)    Recommendations for Other Services       Precautions / Restrictions Precautions Precautions: Fall      Mobility  Bed Mobility Overal bed mobility: Needs Assistance Bed Mobility: Supine to Sit     Supine to sit: Supervision;HOB elevated        Transfers Overall transfer level: Needs assistance Equipment used: Rolling walker (2 wheeled) Transfers: Sit to/from Stand Sit to Stand: Min guard         General transfer comment: verbal cues for technique  Ambulation/Gait Ambulation/Gait assistance: Min guard Ambulation Distance (Feet): 200 Feet Assistive device: Rolling walker (2 wheeled) Gait Pattern/deviations: Step-through pattern;Decreased stride length     General Gait Details: verbal cues for use of RW and posture, min/guard for safety however no LOB observed, HR 72-76 bpm during ambulation, pt denies any symptoms other then 4/10 R sided low back  pain  Stairs            Wheelchair Mobility    Modified Rankin (Stroke Patients Only)       Balance Overall balance assessment: History of Falls                                           Pertinent Vitals/Pain Pain Assessment: Faces Faces Pain Scale: Hurts little more Pain Location: R low back Pain Descriptors / Indicators: Dull Pain Intervention(s): Limited activity within patient's tolerance;Monitored during session;Repositioned    Home Living       Type of Home: Independent living facility         Home Equipment: None      Prior Function Level of Independence: Independent         Comments: increased falls lately, reasons unknown at this time, pt poor historian     Hand Dominance        Extremity/Trunk Assessment        Lower Extremity Assessment Lower Extremity Assessment: Generalized weakness       Communication   Communication: No difficulties  Cognition Arousal/Alertness: Awake/alert Behavior During Therapy: WFL for tasks assessed/performed Overall Cognitive Status: History of cognitive impairments - at baseline                                 General Comments: hx dementia, follows commands, poor memory  General Comments      Exercises     Assessment/Plan    PT Assessment Patient needs continued PT services  PT Problem List Decreased mobility;Decreased balance;Decreased knowledge of use of DME;Decreased safety awareness       PT Treatment Interventions DME instruction;Gait training;Patient/family education;Therapeutic activities;Therapeutic exercise;Functional mobility training;Balance training    PT Goals (Current goals can be found in the Care Plan section)  Acute Rehab PT Goals PT Goal Formulation: With patient Time For Goal Achievement: 05/09/17 Potential to Achieve Goals: Good    Frequency Min 3X/week   Barriers to discharge        Co-evaluation                AM-PAC PT "6 Clicks" Daily Activity  Outcome Measure Difficulty turning over in bed (including adjusting bedclothes, sheets and blankets)?: A Little Difficulty moving from lying on back to sitting on the side of the bed? : A Little Difficulty sitting down on and standing up from a chair with arms (e.g., wheelchair, bedside commode, etc,.)?: Total Help needed moving to and from a bed to chair (including a wheelchair)?: A Little Help needed walking in hospital room?: A Little Help needed climbing 3-5 steps with a railing? : A Little 6 Click Score: 16    End of Session Equipment Utilized During Treatment: Gait belt Activity Tolerance: Patient tolerated treatment well Patient left: in chair;with call bell/phone within reach;with chair alarm set Nurse Communication: Mobility status PT Visit Diagnosis: Difficulty in walking, not elsewhere classified (R26.2);Repeated falls (R29.6)    Time: 0981-1914 PT Time Calculation (min) (ACUTE ONLY): 14 min   Charges:   PT Evaluation $PT Eval Low Complexity: 1 Low     PT G Codes:   PT G-Codes **NOT FOR INPATIENT CLASS** Functional Assessment Tool Used: AM-PAC 6 Clicks Basic Mobility;Clinical judgement Functional Limitation: Mobility: Walking and moving around Mobility: Walking and Moving Around Current Status (N8295): At least 20 percent but less than 40 percent impaired, limited or restricted Mobility: Walking and Moving Around Goal Status [phone]): At least 1 percent but less than 20 percent impaired, limited or restricted    Zenovia Jarred, PT, DPT 05/09/2017 Pager: [phone]   Maida Sale E 05/09/2017, 12:51 PM

## 2017-05-09 NOTE — Progress Notes (Signed)
*  PRELIMINARY RESULTS* Echocardiogram 2D Echocardiogram has been performed.  Jeryl Columbialliott, Oswell Say 05/09/2017, 2:05 PM

## 2017-05-10 NOTE — Progress Notes (Signed)
[facility]

## 2018-08-26 IMAGING — CT CT HEAD W/O CM
4 series · 17 of 47 positions shown, 19 images · non-contrast
Comparison: None.

CLINICAL DATA: Unwitnessed fall.  Syncope.

EXAM:
CT HEAD WITHOUT CONTRAST
TECHNIQUE: Contiguous axial images were obtained from the base of the skull
through the vertex without intravenous contrast.

[Series 2: head w/o · axial · non-contrast · 0.45mm/px · z∈[-156,-41]mm · 7 of 31 slices shown, 9 images]
[im 4/31  brain]
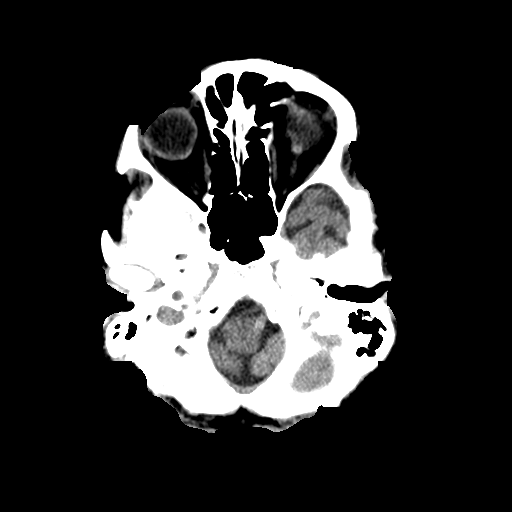
[im 4/31  bone]
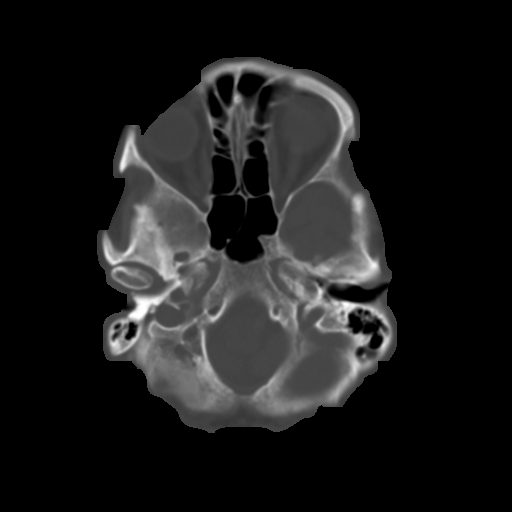
[im 8/31  brain]
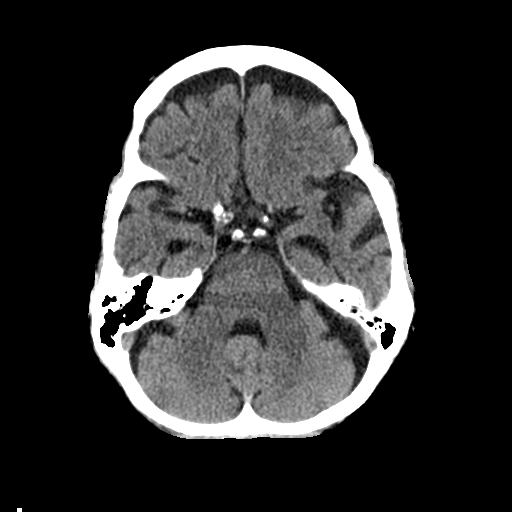
[im 12/31  brain]
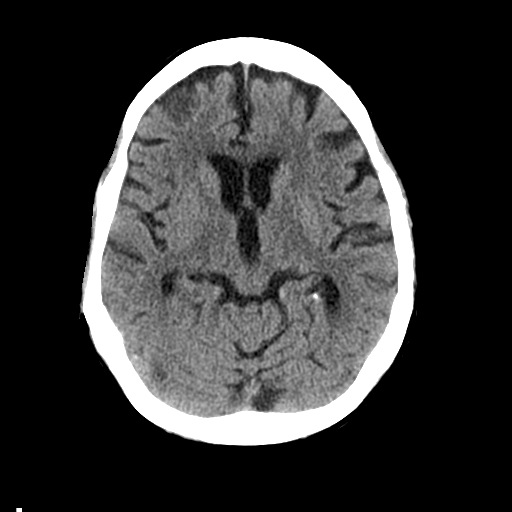
[im 16/31  brain]
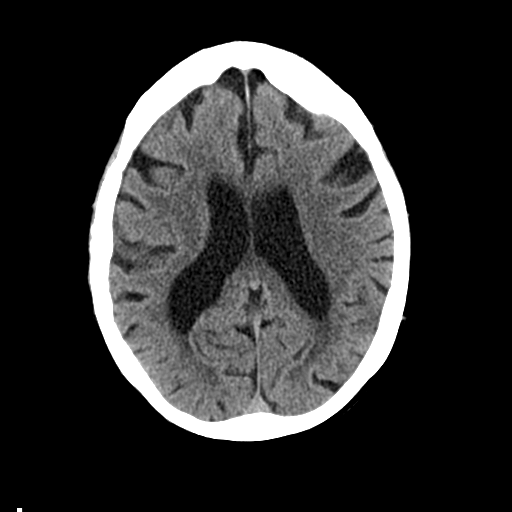
[im 19/31  brain]
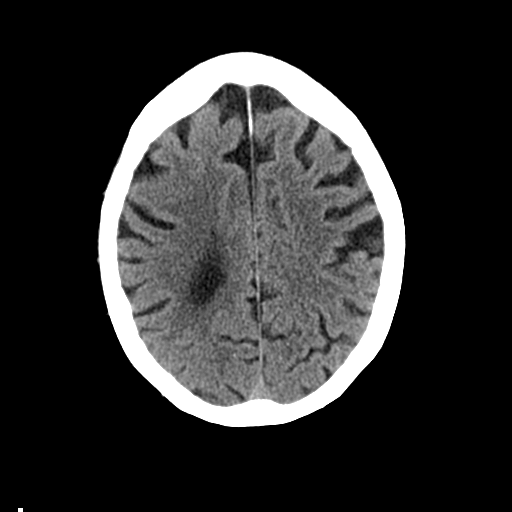
[im 19/31  bone]
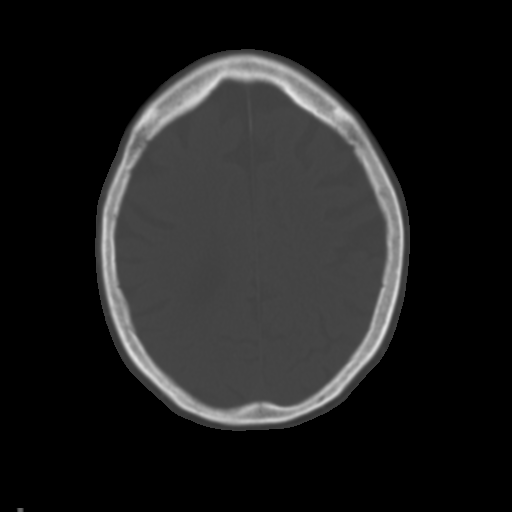
[im 23/31  brain]
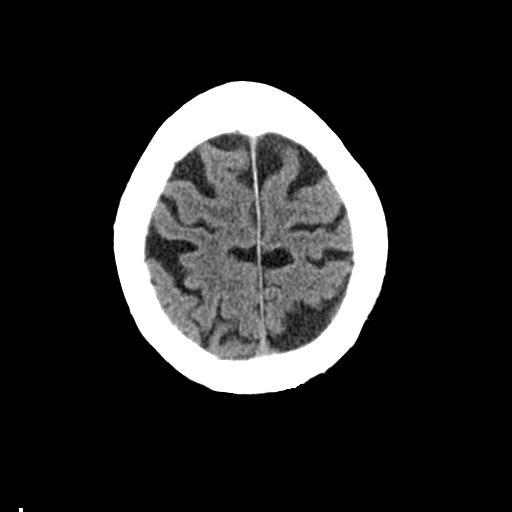
[im 27/31  brain]
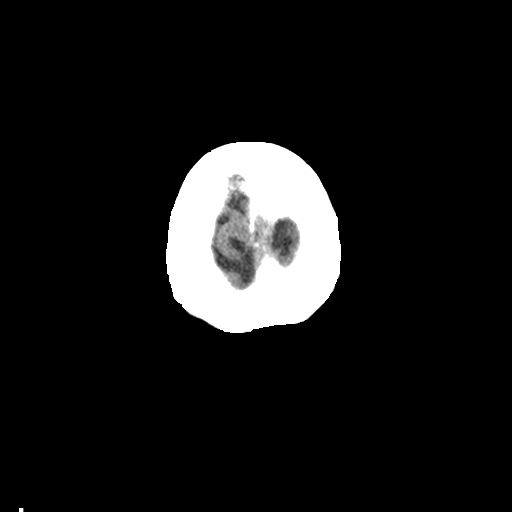

[Series 3: bone windows · axial · 0.45mm/px · z∈[-157,-103]mm · 4 of 77 slices shown]
[im 8/77  bone]
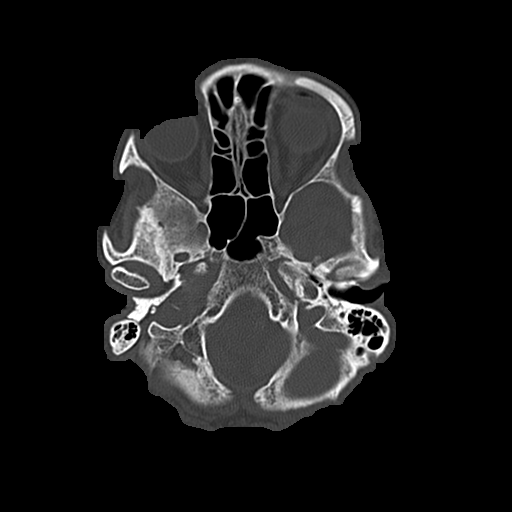
[im 16/77  bone]
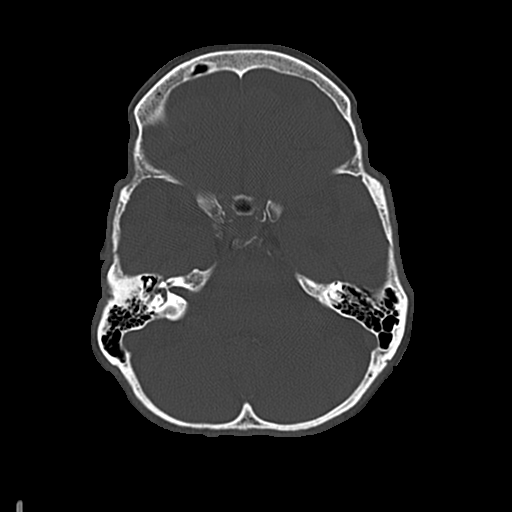
[im 23/77  bone]
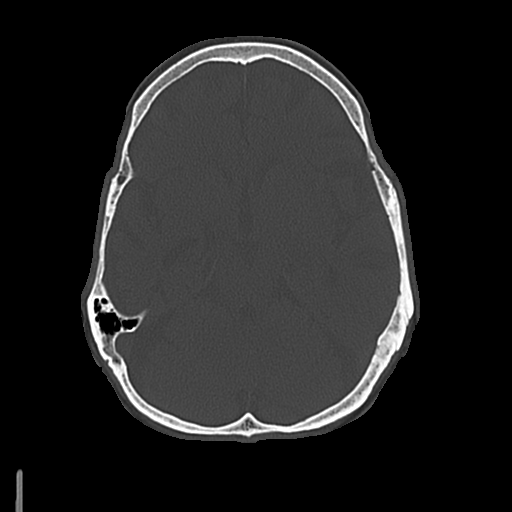
[im 35/77  bone]
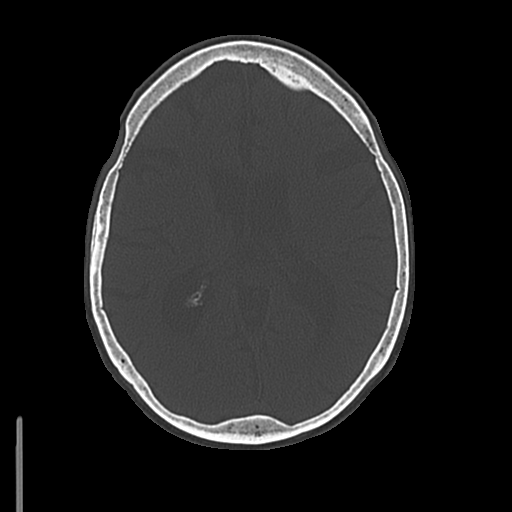

[Series 5: coronal · coronal · 0.31mm/px · 3 of 69 slices shown]
[im 23/69  brain]
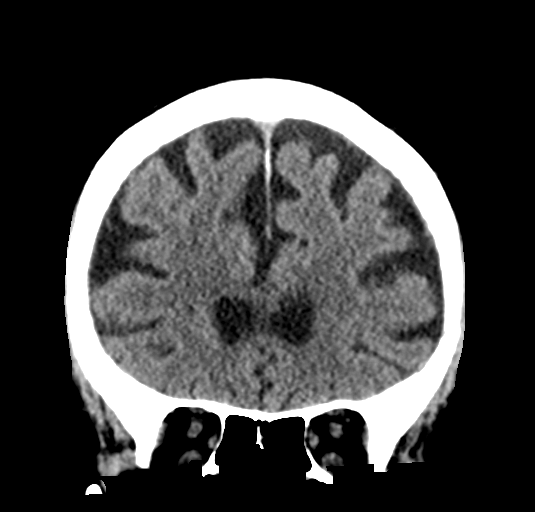
[im 31/69  brain]
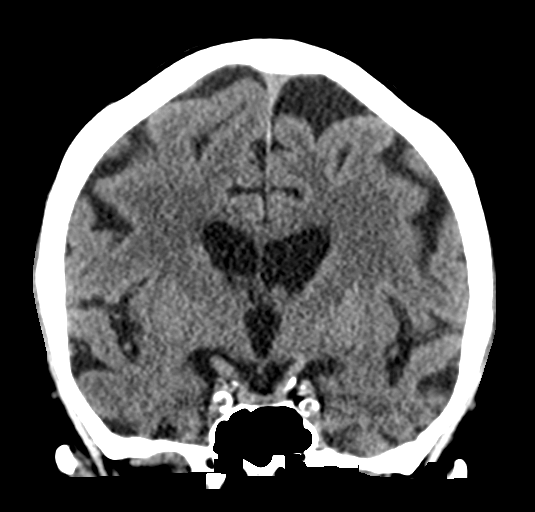
[im 38/69  brain]
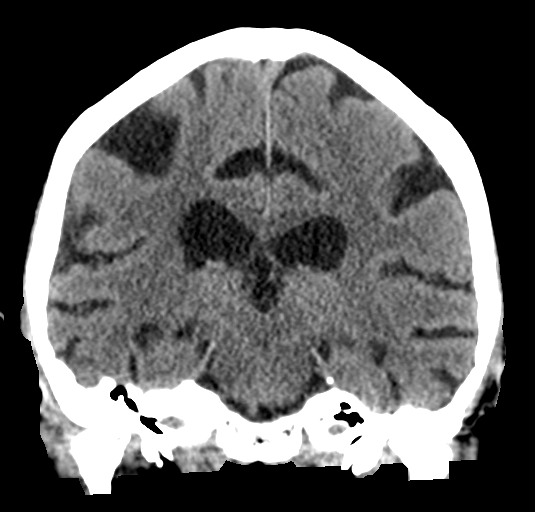

[Series 6: sagittal · sagittal · 0.31mm/px · 3 of 55 slices shown]
[im 19/55  brain]
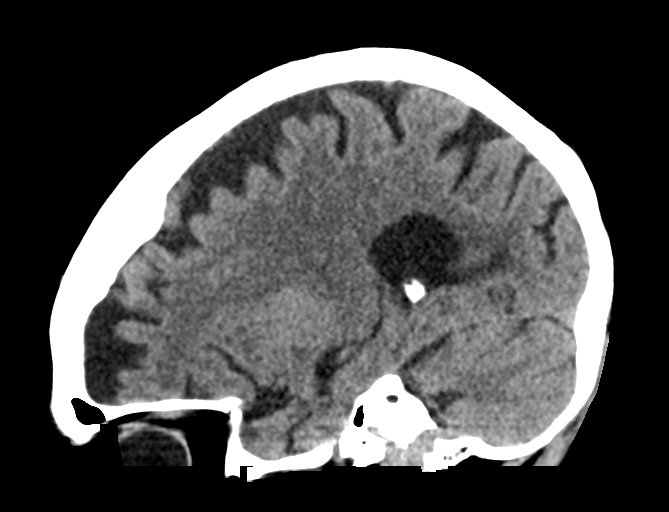
[im 28/55  brain]
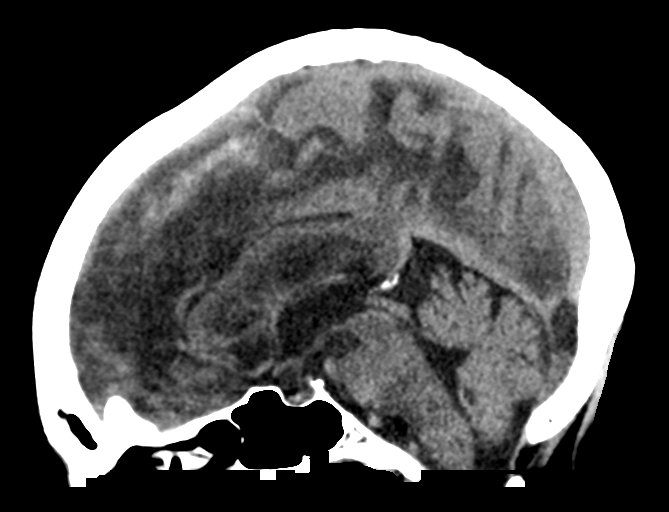
[im 37/55  brain]
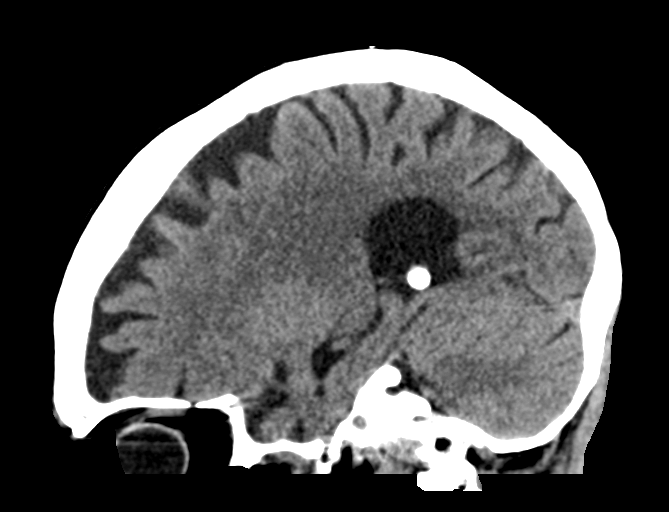

[17 of 47 positions shown; findings below may reference images not displayed]

FINDINGS: Brain: Diffuse cerebral atrophy. No acute intracranial abnormality.
Specifically, no hemorrhage, hydrocephalus, mass lesion, acute
infarction, or significant intracranial injury.

Vascular: No hyperdense vessel or unexpected calcification.

Skull: No acute calvarial abnormality.

Sinuses/Orbits: Visualized paranasal sinuses and mastoids clear.
Orbital soft tissues unremarkable.

Other: None
IMPRESSION: No acute intracranial abnormality.  Diffuse atrophy.

## 2022-07-26 DEATH — deceased
